# Patient Record
Sex: Male | Born: 1955
Health system: Southern US, Community
[De-identification: ages and names within clinical notes are randomized; demographics above are authoritative.]

---

## 2011-10-27 ENCOUNTER — Encounter (INDEPENDENT_AMBULATORY_CARE_PROVIDER_SITE_OTHER): Payer: 59 | Admitting: Ophthalmology

## 2011-10-27 DIAGNOSIS — H33109 Unspecified retinoschisis, unspecified eye: Secondary | ICD-10-CM

## 2011-10-27 DIAGNOSIS — I10 Essential (primary) hypertension: Secondary | ICD-10-CM

## 2011-10-27 DIAGNOSIS — H35039 Hypertensive retinopathy, unspecified eye: Secondary | ICD-10-CM

## 2011-10-27 DIAGNOSIS — H43819 Vitreous degeneration, unspecified eye: Secondary | ICD-10-CM

## 2011-10-27 DIAGNOSIS — H35729 Serous detachment of retinal pigment epithelium, unspecified eye: Secondary | ICD-10-CM

## 2011-10-27 DIAGNOSIS — E11319 Type 2 diabetes mellitus with unspecified diabetic retinopathy without macular edema: Secondary | ICD-10-CM

## 2011-10-27 DIAGNOSIS — E1139 Type 2 diabetes mellitus with other diabetic ophthalmic complication: Secondary | ICD-10-CM

## 2012-10-26 ENCOUNTER — Ambulatory Visit (INDEPENDENT_AMBULATORY_CARE_PROVIDER_SITE_OTHER): Payer: 59 | Admitting: Ophthalmology

## 2012-10-26 DIAGNOSIS — H2510 Age-related nuclear cataract, unspecified eye: Secondary | ICD-10-CM

## 2012-10-26 DIAGNOSIS — H43819 Vitreous degeneration, unspecified eye: Secondary | ICD-10-CM

## 2012-10-26 DIAGNOSIS — H33109 Unspecified retinoschisis, unspecified eye: Secondary | ICD-10-CM

## 2012-10-26 DIAGNOSIS — I10 Essential (primary) hypertension: Secondary | ICD-10-CM

## 2012-10-26 DIAGNOSIS — H35729 Serous detachment of retinal pigment epithelium, unspecified eye: Secondary | ICD-10-CM

## 2013-10-26 ENCOUNTER — Ambulatory Visit (INDEPENDENT_AMBULATORY_CARE_PROVIDER_SITE_OTHER): Payer: 59 | Admitting: Ophthalmology

## 2013-11-16 ENCOUNTER — Ambulatory Visit (INDEPENDENT_AMBULATORY_CARE_PROVIDER_SITE_OTHER): Payer: 59 | Admitting: Ophthalmology

## 2013-11-16 DIAGNOSIS — H35729 Serous detachment of retinal pigment epithelium, unspecified eye: Secondary | ICD-10-CM

## 2013-11-16 DIAGNOSIS — E11319 Type 2 diabetes mellitus with unspecified diabetic retinopathy without macular edema: Secondary | ICD-10-CM

## 2013-11-16 DIAGNOSIS — E1165 Type 2 diabetes mellitus with hyperglycemia: Secondary | ICD-10-CM

## 2013-11-16 DIAGNOSIS — H33109 Unspecified retinoschisis, unspecified eye: Secondary | ICD-10-CM

## 2013-11-16 DIAGNOSIS — E1139 Type 2 diabetes mellitus with other diabetic ophthalmic complication: Secondary | ICD-10-CM

## 2013-11-16 DIAGNOSIS — H43819 Vitreous degeneration, unspecified eye: Secondary | ICD-10-CM

## 2013-11-16 DIAGNOSIS — H2510 Age-related nuclear cataract, unspecified eye: Secondary | ICD-10-CM

## 2013-11-16 DIAGNOSIS — H35039 Hypertensive retinopathy, unspecified eye: Secondary | ICD-10-CM

## 2013-11-16 DIAGNOSIS — I10 Essential (primary) hypertension: Secondary | ICD-10-CM

## 2014-11-19 ENCOUNTER — Ambulatory Visit (INDEPENDENT_AMBULATORY_CARE_PROVIDER_SITE_OTHER): Payer: 59 | Admitting: Ophthalmology

## 2014-11-19 DIAGNOSIS — I10 Essential (primary) hypertension: Secondary | ICD-10-CM | POA: Diagnosis not present

## 2014-11-19 DIAGNOSIS — E11329 Type 2 diabetes mellitus with mild nonproliferative diabetic retinopathy without macular edema: Secondary | ICD-10-CM

## 2014-11-19 DIAGNOSIS — H33103 Unspecified retinoschisis, bilateral: Secondary | ICD-10-CM | POA: Diagnosis not present

## 2014-11-19 DIAGNOSIS — E11319 Type 2 diabetes mellitus with unspecified diabetic retinopathy without macular edema: Secondary | ICD-10-CM | POA: Diagnosis not present

## 2014-11-19 DIAGNOSIS — H43813 Vitreous degeneration, bilateral: Secondary | ICD-10-CM | POA: Diagnosis not present

## 2014-11-19 DIAGNOSIS — H35722 Serous detachment of retinal pigment epithelium, left eye: Secondary | ICD-10-CM | POA: Diagnosis not present

## 2014-11-19 DIAGNOSIS — H35033 Hypertensive retinopathy, bilateral: Secondary | ICD-10-CM | POA: Diagnosis not present

## 2015-11-19 ENCOUNTER — Ambulatory Visit (INDEPENDENT_AMBULATORY_CARE_PROVIDER_SITE_OTHER): Payer: 59 | Admitting: Ophthalmology

## 2015-11-19 DIAGNOSIS — H2513 Age-related nuclear cataract, bilateral: Secondary | ICD-10-CM

## 2015-11-19 DIAGNOSIS — E11319 Type 2 diabetes mellitus with unspecified diabetic retinopathy without macular edema: Secondary | ICD-10-CM | POA: Diagnosis not present

## 2015-11-19 DIAGNOSIS — H35033 Hypertensive retinopathy, bilateral: Secondary | ICD-10-CM

## 2015-11-19 DIAGNOSIS — I10 Essential (primary) hypertension: Secondary | ICD-10-CM

## 2015-11-19 DIAGNOSIS — E113293 Type 2 diabetes mellitus with mild nonproliferative diabetic retinopathy without macular edema, bilateral: Secondary | ICD-10-CM | POA: Diagnosis not present

## 2015-11-19 DIAGNOSIS — H43813 Vitreous degeneration, bilateral: Secondary | ICD-10-CM | POA: Diagnosis not present

## 2016-06-08 DIAGNOSIS — E785 Hyperlipidemia, unspecified: Secondary | ICD-10-CM | POA: Diagnosis not present

## 2016-06-08 DIAGNOSIS — I10 Essential (primary) hypertension: Secondary | ICD-10-CM | POA: Diagnosis not present

## 2016-06-08 DIAGNOSIS — E119 Type 2 diabetes mellitus without complications: Secondary | ICD-10-CM | POA: Diagnosis not present

## 2016-06-14 DIAGNOSIS — E291 Testicular hypofunction: Secondary | ICD-10-CM | POA: Diagnosis not present

## 2016-07-01 DIAGNOSIS — G4733 Obstructive sleep apnea (adult) (pediatric): Secondary | ICD-10-CM | POA: Diagnosis not present

## 2016-07-01 DIAGNOSIS — Z9989 Dependence on other enabling machines and devices: Secondary | ICD-10-CM | POA: Diagnosis not present

## 2016-07-01 DIAGNOSIS — I10 Essential (primary) hypertension: Secondary | ICD-10-CM | POA: Diagnosis not present

## 2016-08-20 DIAGNOSIS — R202 Paresthesia of skin: Secondary | ICD-10-CM | POA: Diagnosis not present

## 2016-11-18 ENCOUNTER — Ambulatory Visit (INDEPENDENT_AMBULATORY_CARE_PROVIDER_SITE_OTHER): Payer: 59 | Admitting: Ophthalmology

## 2016-11-18 DIAGNOSIS — H43813 Vitreous degeneration, bilateral: Secondary | ICD-10-CM | POA: Diagnosis not present

## 2016-11-18 DIAGNOSIS — I10 Essential (primary) hypertension: Secondary | ICD-10-CM

## 2016-11-18 DIAGNOSIS — H3570 Unspecified separation of retinal layers: Secondary | ICD-10-CM | POA: Diagnosis not present

## 2016-11-18 DIAGNOSIS — E113293 Type 2 diabetes mellitus with mild nonproliferative diabetic retinopathy without macular edema, bilateral: Secondary | ICD-10-CM | POA: Diagnosis not present

## 2016-11-18 DIAGNOSIS — E11319 Type 2 diabetes mellitus with unspecified diabetic retinopathy without macular edema: Secondary | ICD-10-CM | POA: Diagnosis not present

## 2016-11-18 DIAGNOSIS — H35033 Hypertensive retinopathy, bilateral: Secondary | ICD-10-CM

## 2016-12-09 DIAGNOSIS — E291 Testicular hypofunction: Secondary | ICD-10-CM | POA: Diagnosis not present

## 2016-12-09 DIAGNOSIS — N183 Chronic kidney disease, stage 3 (moderate): Secondary | ICD-10-CM | POA: Diagnosis not present

## 2016-12-09 DIAGNOSIS — E1121 Type 2 diabetes mellitus with diabetic nephropathy: Secondary | ICD-10-CM | POA: Diagnosis not present

## 2017-02-15 DIAGNOSIS — Z1211 Encounter for screening for malignant neoplasm of colon: Secondary | ICD-10-CM | POA: Diagnosis not present

## 2017-04-13 DIAGNOSIS — Z1211 Encounter for screening for malignant neoplasm of colon: Secondary | ICD-10-CM | POA: Diagnosis not present

## 2017-06-09 DIAGNOSIS — E785 Hyperlipidemia, unspecified: Secondary | ICD-10-CM | POA: Diagnosis not present

## 2017-06-09 DIAGNOSIS — I10 Essential (primary) hypertension: Secondary | ICD-10-CM | POA: Diagnosis not present

## 2017-06-09 DIAGNOSIS — N183 Chronic kidney disease, stage 3 (moderate): Secondary | ICD-10-CM | POA: Diagnosis not present

## 2017-06-09 DIAGNOSIS — E1121 Type 2 diabetes mellitus with diabetic nephropathy: Secondary | ICD-10-CM | POA: Diagnosis not present

## 2017-09-14 DIAGNOSIS — I10 Essential (primary) hypertension: Secondary | ICD-10-CM | POA: Diagnosis not present

## 2017-09-14 DIAGNOSIS — G4733 Obstructive sleep apnea (adult) (pediatric): Secondary | ICD-10-CM | POA: Diagnosis not present

## 2017-11-01 DIAGNOSIS — G4733 Obstructive sleep apnea (adult) (pediatric): Secondary | ICD-10-CM | POA: Diagnosis not present

## 2017-11-23 ENCOUNTER — Encounter (INDEPENDENT_AMBULATORY_CARE_PROVIDER_SITE_OTHER): Payer: 59 | Admitting: Ophthalmology

## 2017-12-09 DIAGNOSIS — E1121 Type 2 diabetes mellitus with diabetic nephropathy: Secondary | ICD-10-CM | POA: Diagnosis not present

## 2017-12-09 DIAGNOSIS — I10 Essential (primary) hypertension: Secondary | ICD-10-CM | POA: Diagnosis not present

## 2017-12-09 DIAGNOSIS — E785 Hyperlipidemia, unspecified: Secondary | ICD-10-CM | POA: Diagnosis not present

## 2017-12-09 DIAGNOSIS — N183 Chronic kidney disease, stage 3 (moderate): Secondary | ICD-10-CM | POA: Diagnosis not present

## 2017-12-12 ENCOUNTER — Encounter (INDEPENDENT_AMBULATORY_CARE_PROVIDER_SITE_OTHER): Payer: 59 | Admitting: Ophthalmology

## 2017-12-12 DIAGNOSIS — I10 Essential (primary) hypertension: Secondary | ICD-10-CM

## 2017-12-12 DIAGNOSIS — H35033 Hypertensive retinopathy, bilateral: Secondary | ICD-10-CM

## 2017-12-12 DIAGNOSIS — E11319 Type 2 diabetes mellitus with unspecified diabetic retinopathy without macular edema: Secondary | ICD-10-CM | POA: Diagnosis not present

## 2017-12-12 DIAGNOSIS — E113293 Type 2 diabetes mellitus with mild nonproliferative diabetic retinopathy without macular edema, bilateral: Secondary | ICD-10-CM

## 2017-12-12 DIAGNOSIS — H43813 Vitreous degeneration, bilateral: Secondary | ICD-10-CM

## 2018-02-28 ENCOUNTER — Ambulatory Visit (INDEPENDENT_AMBULATORY_CARE_PROVIDER_SITE_OTHER): Payer: Self-pay

## 2018-02-28 ENCOUNTER — Other Ambulatory Visit (INDEPENDENT_AMBULATORY_CARE_PROVIDER_SITE_OTHER): Payer: Self-pay

## 2018-02-28 ENCOUNTER — Ambulatory Visit (INDEPENDENT_AMBULATORY_CARE_PROVIDER_SITE_OTHER): Payer: 59 | Admitting: Orthopaedic Surgery

## 2018-02-28 DIAGNOSIS — G8929 Other chronic pain: Secondary | ICD-10-CM | POA: Diagnosis not present

## 2018-02-28 DIAGNOSIS — M25562 Pain in left knee: Secondary | ICD-10-CM | POA: Diagnosis not present

## 2018-02-28 NOTE — Progress Notes (Signed)
   Office Visit Note   Patient: Earl Ford           Date of Birth: 06/14/1955           MRN: [id number] Visit Date: 02/28/2018              Requested by: No referring provider defined for this encounter. PCP: Patient, No Pcp Per   Assessment & Plan: Visit Diagnoses:  1. Chronic pain of left knee     Plan: At this point patient continues to have chronic left knee pain for 5 months despite conservative treatment.  My suspicion is that he either has a degenerative medial medial meniscus tear or symptomatic plica versus patellofemoral chondromalacia.  We discussed MRI versus injection and physical therapy.  He would like to get an MRI first therefore we will order this to rule out structural abnormalities.  Follow-up after the MRI.  Follow-Up Instructions: Return in about 10 days (around 03/10/2018).   Orders:  Orders Placed This Encounter  Procedures  . XR KNEE 3 VIEW LEFT   No orders of the defined types were placed in this encounter.     Procedures: No procedures performed   Clinical Data: No additional findings.   Subjective: Chief Complaint  Patient presents with  . Left Knee - Pain    Earl BurdockRichard is a very pleasant 62 year old gentleman who comes in with 5 months of left knee pain of insidious onset.  He initially had swelling but it resolved after 2 months.  He states that he has pain on the medial side of his knee and it feels like it wants to pop out of joint.  It is very painful when he goes downstairs.  He wears a knee brace which does help with support.  He denies any numbness and tingling or radiation of pain.  The pain is better with rest.   Review of Systems  Constitutional: Negative.   All other systems reviewed and are negative.    Objective: Vital Signs: There were no vitals taken for this visit.  Physical Exam  Constitutional: He is oriented to person, place, and time. He appears well-developed and well-nourished.  HENT:  Head: Normocephalic and  atraumatic.  Eyes: Pupils are equal, round, and reactive to light.  Neck: Neck supple.  Pulmonary/Chest: Effort normal.  Abdominal: Soft.  Musculoskeletal: Normal range of motion.  Neurological: He is alert and oriented to person, place, and time.  Skin: Skin is warm.  Psychiatric: He has a normal mood and affect. His behavior is normal. Judgment and thought content normal.  Nursing note and vitals reviewed.   Ortho Exam Left knee exam shows no joint effusion.  Is no medial joint line tenderness.  Patella tracking is normal.  Normal range of motion.  Collaterals and cruciates are stable. Specialty Comments:  No specialty comments available.  Imaging: Xr Knee 3 View Left  Result Date: 02/28/2018 No evidence of degenerative joint disease.    PMFS History: There are no active problems to display for this patient.  No past medical history on file.  No family history on file.   Social History   Occupational History  . Not on file  Tobacco Use  . Smoking status: Not on file  Substance and Sexual Activity  . Alcohol use: Not on file  . Drug use: Not on file  . Sexual activity: Not on file

## 2018-03-10 ENCOUNTER — Ambulatory Visit (INDEPENDENT_AMBULATORY_CARE_PROVIDER_SITE_OTHER): Payer: 59 | Admitting: Orthopaedic Surgery

## 2018-03-20 ENCOUNTER — Ambulatory Visit
Admission: RE | Admit: 2018-03-20 | Discharge: 2018-03-20 | Disposition: A | Discharge status: Home / Self Care | Payer: 59 | Source: Ambulatory Visit | Attending: Orthopaedic Surgery | Admitting: Orthopaedic Surgery

## 2018-03-20 DIAGNOSIS — M25562 Pain in left knee: Principal | ICD-10-CM

## 2018-03-20 DIAGNOSIS — M23322 Other meniscus derangements, posterior horn of medial meniscus, left knee: Secondary | ICD-10-CM | POA: Diagnosis not present

## 2018-03-20 DIAGNOSIS — G8929 Other chronic pain: Secondary | ICD-10-CM

## 2018-03-30 ENCOUNTER — Encounter (INDEPENDENT_AMBULATORY_CARE_PROVIDER_SITE_OTHER): Payer: Self-pay | Admitting: Orthopaedic Surgery

## 2018-03-30 ENCOUNTER — Ambulatory Visit (INDEPENDENT_AMBULATORY_CARE_PROVIDER_SITE_OTHER): Payer: 59 | Admitting: Orthopaedic Surgery

## 2018-03-30 DIAGNOSIS — S83242A Other tear of medial meniscus, current injury, left knee, initial encounter: Secondary | ICD-10-CM | POA: Diagnosis not present

## 2018-03-30 NOTE — Progress Notes (Signed)
   Office Visit Note   Patient: Earl Ford           Date of Birth: 05-03-1955           MRN: [id number] Visit Date: 03/30/2018              Requested by: No referring provider defined for this encounter. PCP: Patient, No Pcp Per   Assessment & Plan: Visit Diagnoses:  1. Acute tear medial meniscus, left, initial encounter     Plan: MRI shows an undersurface horizontal tear of the medial meniscus.  The meniscus does not appear to be extruded.  Overall symptomatically seems to be doing okay and he does not have an effusion.  He still is able to be pretty active.  At this point patient will just try to live with this and try a knee brace.  He has had a previous arthroscopic knee surgery on the other knee for a torn meniscus and he feels like this pain is not anywhere close to how it was before.  I offered a cortisone injection but he declined.  He will let us know if he starts to have problems with his knee.  Otherwise he can follow-up as needed.  Follow-Up Instructions: Return if symptoms worsen or fail to improve.   Orders:  No orders of the defined types were placed in this encounter.  No orders of the defined types were placed in this encounter.     Procedures: No procedures performed   Clinical Data: No additional findings.   Subjective: Chief Complaint  Patient presents with  . Left Knee - Pain, Follow-up    Mr. Ginley follows up today for review of his left knee MRI.  He states that overall it is not overly symptomatic.  Denies any swelling.  The pain is mild and is localized to the medial aspect of the knee.  He denies any consistent mechanical symptoms.  Sometimes it feels like his joint is popping out of place.   Review of Systems   Objective: Vital Signs: There were no vitals taken for this visit.  Physical Exam  Ortho Exam Left knee exam shows no joint effusion.  No medial joint tenderness.  He has some referred pain on the medial side of his  knee. Specialty Comments:  No specialty comments available.  Imaging: No results found.   PMFS History: Patient Active Problem List   Diagnosis Date Noted  . Acute tear medial meniscus, left, initial encounter 03/30/2018   History reviewed. No pertinent past medical history.  History reviewed. No pertinent family history.  History reviewed. No pertinent surgical history. Social History   Occupational History  . Not on file  Tobacco Use  . Smoking status: Not on file  Substance and Sexual Activity  . Alcohol use: Not on file  . Drug use: Not on file  . Sexual activity: Not on file

## 2018-06-26 DIAGNOSIS — Z23 Encounter for immunization: Secondary | ICD-10-CM | POA: Diagnosis not present

## 2018-06-26 DIAGNOSIS — E785 Hyperlipidemia, unspecified: Secondary | ICD-10-CM | POA: Diagnosis not present

## 2018-06-26 DIAGNOSIS — I10 Essential (primary) hypertension: Secondary | ICD-10-CM | POA: Diagnosis not present

## 2018-06-26 DIAGNOSIS — Z125 Encounter for screening for malignant neoplasm of prostate: Secondary | ICD-10-CM | POA: Diagnosis not present

## 2018-06-26 DIAGNOSIS — E1121 Type 2 diabetes mellitus with diabetic nephropathy: Secondary | ICD-10-CM | POA: Diagnosis not present

## 2018-06-26 DIAGNOSIS — Z0000 Encounter for general adult medical examination without abnormal findings: Secondary | ICD-10-CM | POA: Diagnosis not present

## 2018-07-14 DIAGNOSIS — E119 Type 2 diabetes mellitus without complications: Secondary | ICD-10-CM | POA: Diagnosis not present

## 2018-12-14 ENCOUNTER — Encounter (INDEPENDENT_AMBULATORY_CARE_PROVIDER_SITE_OTHER): Payer: 59 | Admitting: Ophthalmology

## 2018-12-27 DIAGNOSIS — N183 Chronic kidney disease, stage 3 unspecified: Secondary | ICD-10-CM | POA: Diagnosis not present

## 2018-12-27 DIAGNOSIS — E785 Hyperlipidemia, unspecified: Secondary | ICD-10-CM | POA: Diagnosis not present

## 2018-12-27 DIAGNOSIS — E1121 Type 2 diabetes mellitus with diabetic nephropathy: Secondary | ICD-10-CM | POA: Diagnosis not present

## 2018-12-27 DIAGNOSIS — I10 Essential (primary) hypertension: Secondary | ICD-10-CM | POA: Diagnosis not present

## 2019-01-04 DIAGNOSIS — N183 Chronic kidney disease, stage 3 unspecified: Secondary | ICD-10-CM | POA: Diagnosis not present

## 2019-01-04 DIAGNOSIS — Z23 Encounter for immunization: Secondary | ICD-10-CM | POA: Diagnosis not present

## 2019-01-04 DIAGNOSIS — E1121 Type 2 diabetes mellitus with diabetic nephropathy: Secondary | ICD-10-CM | POA: Diagnosis not present

## 2019-02-26 DIAGNOSIS — F330 Major depressive disorder, recurrent, mild: Secondary | ICD-10-CM | POA: Diagnosis not present

## 2019-02-27 DIAGNOSIS — F330 Major depressive disorder, recurrent, mild: Secondary | ICD-10-CM | POA: Diagnosis not present

## 2019-06-29 DIAGNOSIS — Z0000 Encounter for general adult medical examination without abnormal findings: Secondary | ICD-10-CM | POA: Diagnosis not present

## 2019-06-29 DIAGNOSIS — Z125 Encounter for screening for malignant neoplasm of prostate: Secondary | ICD-10-CM | POA: Diagnosis not present

## 2019-06-29 DIAGNOSIS — E785 Hyperlipidemia, unspecified: Secondary | ICD-10-CM | POA: Diagnosis not present

## 2019-06-29 DIAGNOSIS — Z5181 Encounter for therapeutic drug level monitoring: Secondary | ICD-10-CM | POA: Diagnosis not present

## 2019-06-29 DIAGNOSIS — G47419 Narcolepsy without cataplexy: Secondary | ICD-10-CM | POA: Diagnosis not present

## 2019-06-29 DIAGNOSIS — I10 Essential (primary) hypertension: Secondary | ICD-10-CM | POA: Diagnosis not present

## 2019-06-29 DIAGNOSIS — E1121 Type 2 diabetes mellitus with diabetic nephropathy: Secondary | ICD-10-CM | POA: Diagnosis not present

## 2019-09-28 ENCOUNTER — Encounter (INDEPENDENT_AMBULATORY_CARE_PROVIDER_SITE_OTHER): Payer: BC Managed Care – PPO | Admitting: Ophthalmology

## 2019-09-28 ENCOUNTER — Other Ambulatory Visit: Payer: Self-pay

## 2019-09-28 DIAGNOSIS — E11319 Type 2 diabetes mellitus with unspecified diabetic retinopathy without macular edema: Secondary | ICD-10-CM | POA: Diagnosis not present

## 2019-09-28 DIAGNOSIS — I10 Essential (primary) hypertension: Secondary | ICD-10-CM | POA: Diagnosis not present

## 2019-09-28 DIAGNOSIS — E113291 Type 2 diabetes mellitus with mild nonproliferative diabetic retinopathy without macular edema, right eye: Secondary | ICD-10-CM

## 2019-09-28 DIAGNOSIS — E113392 Type 2 diabetes mellitus with moderate nonproliferative diabetic retinopathy without macular edema, left eye: Secondary | ICD-10-CM

## 2019-09-28 DIAGNOSIS — H35033 Hypertensive retinopathy, bilateral: Secondary | ICD-10-CM

## 2019-09-28 DIAGNOSIS — H43813 Vitreous degeneration, bilateral: Secondary | ICD-10-CM

## 2019-12-14 ENCOUNTER — Encounter (INDEPENDENT_AMBULATORY_CARE_PROVIDER_SITE_OTHER): Payer: 59 | Admitting: Ophthalmology

## 2020-01-04 DIAGNOSIS — F3341 Major depressive disorder, recurrent, in partial remission: Secondary | ICD-10-CM | POA: Diagnosis not present

## 2020-01-04 DIAGNOSIS — G47419 Narcolepsy without cataplexy: Secondary | ICD-10-CM | POA: Diagnosis not present

## 2020-01-04 DIAGNOSIS — I10 Essential (primary) hypertension: Secondary | ICD-10-CM | POA: Diagnosis not present

## 2020-01-04 DIAGNOSIS — Z23 Encounter for immunization: Secondary | ICD-10-CM | POA: Diagnosis not present

## 2020-01-04 DIAGNOSIS — E1121 Type 2 diabetes mellitus with diabetic nephropathy: Secondary | ICD-10-CM | POA: Diagnosis not present

## 2020-10-03 ENCOUNTER — Other Ambulatory Visit: Payer: Self-pay

## 2020-10-03 ENCOUNTER — Encounter (INDEPENDENT_AMBULATORY_CARE_PROVIDER_SITE_OTHER): Payer: Managed Care, Other (non HMO) | Admitting: Ophthalmology

## 2020-10-03 DIAGNOSIS — I10 Essential (primary) hypertension: Secondary | ICD-10-CM

## 2020-10-03 DIAGNOSIS — E113392 Type 2 diabetes mellitus with moderate nonproliferative diabetic retinopathy without macular edema, left eye: Secondary | ICD-10-CM | POA: Diagnosis not present

## 2020-10-03 DIAGNOSIS — H2513 Age-related nuclear cataract, bilateral: Secondary | ICD-10-CM

## 2020-10-03 DIAGNOSIS — E113291 Type 2 diabetes mellitus with mild nonproliferative diabetic retinopathy without macular edema, right eye: Secondary | ICD-10-CM | POA: Diagnosis not present

## 2020-10-03 DIAGNOSIS — H35722 Serous detachment of retinal pigment epithelium, left eye: Secondary | ICD-10-CM

## 2020-10-03 DIAGNOSIS — H43813 Vitreous degeneration, bilateral: Secondary | ICD-10-CM

## 2020-10-03 DIAGNOSIS — H35033 Hypertensive retinopathy, bilateral: Secondary | ICD-10-CM

## 2020-11-03 IMAGING — MR MR KNEE*L* W/O CM
4 of 6 series · 22 of 40 positions shown · non-contrast
Comparison: None.

CLINICAL DATA: The patient feels as if his left knee is popping out
of joint. Pain. No known injury.

EXAM:
MRI OF THE LEFT KNEE WITHOUT CONTRAST
TECHNIQUE: Multiplanar, multisequence MR imaging of the knee was performed. No
intravenous contrast was administered.

[Series 3: T2 fat-sat · axial · 4.0mm · 0.50mm/px · z∈[-68,+47]mm · 4 of 24 slices shown (1 of 2)]
[im 1/24]
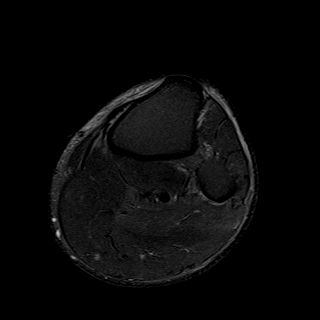
[im 5/24]
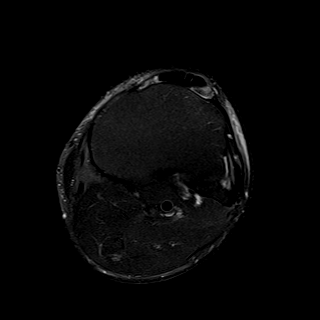
[im 14/24]
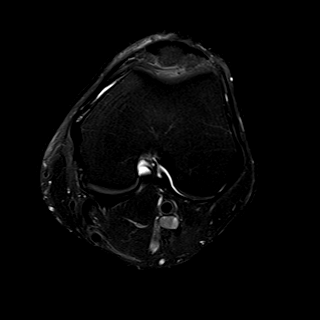
[im 24/24]
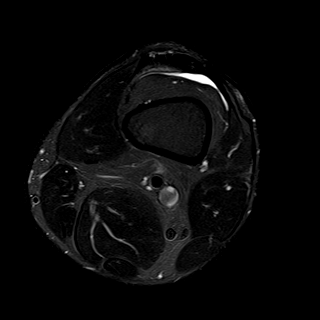

[Series 5: T2 fat-sat · coronal · 4.0mm · 0.29mm/px · 3 of 24 slices shown (2 of 2)]
[im 5/24]
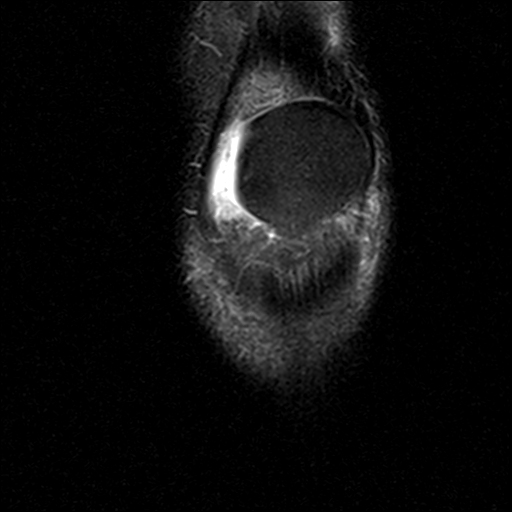
[im 14/24]
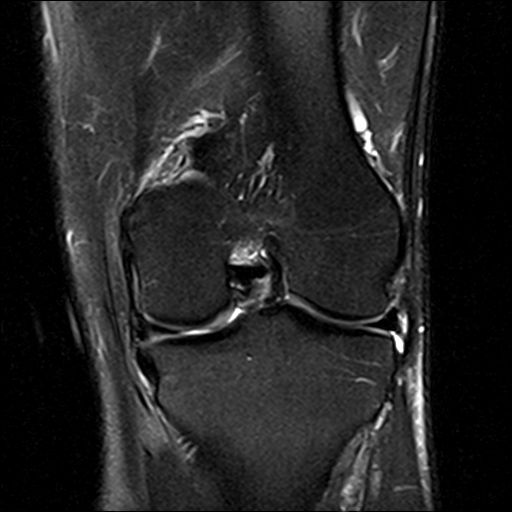
[im 24/24]
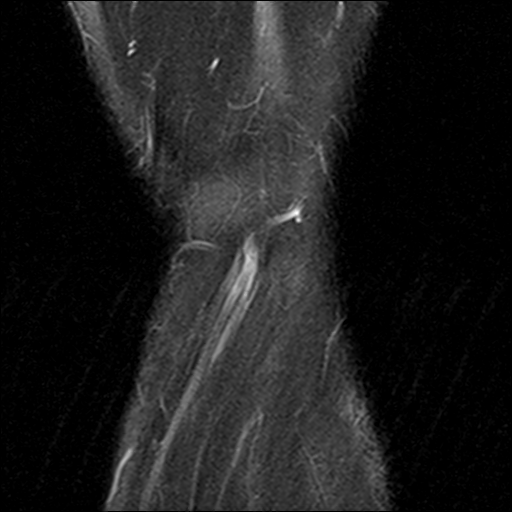

[Series 6: PD fat-sat · sagittal · 3.0mm · 0.29mm/px · 7 of 27 slices shown (1 of 2)]
[im 1/27]
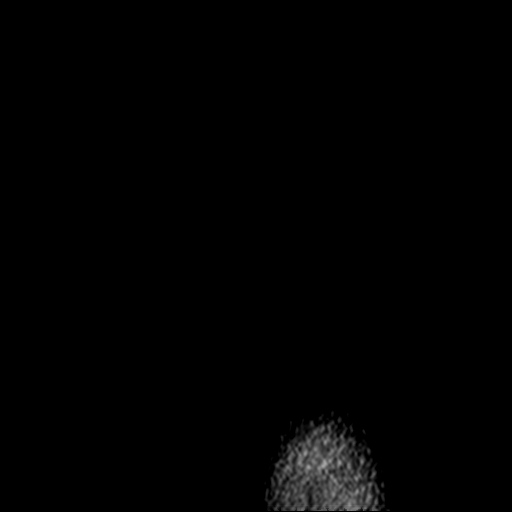
[im 5/27]
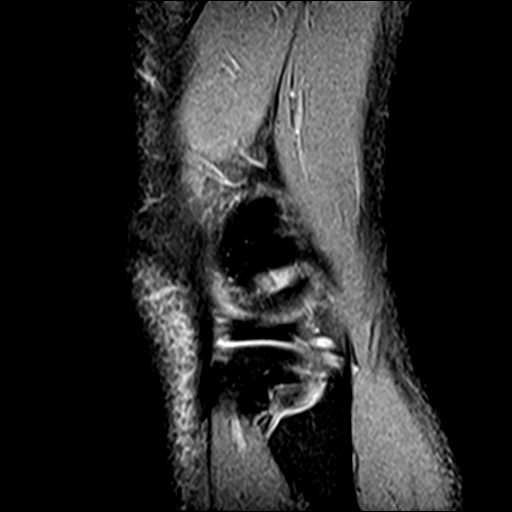
[im 9/27]
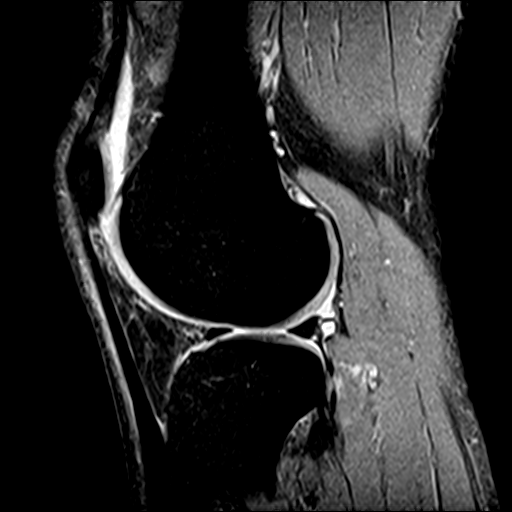
[im 14/27]
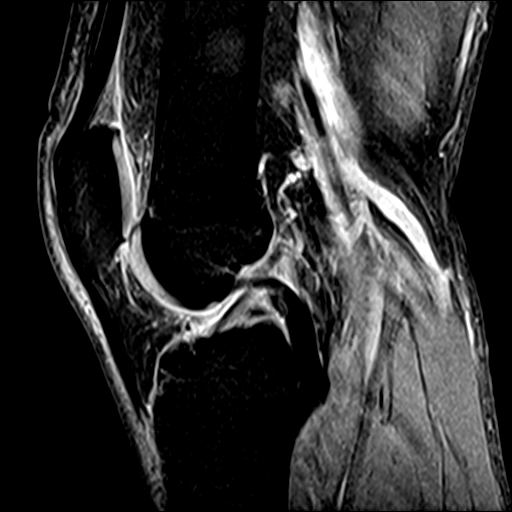
[im 18/27]
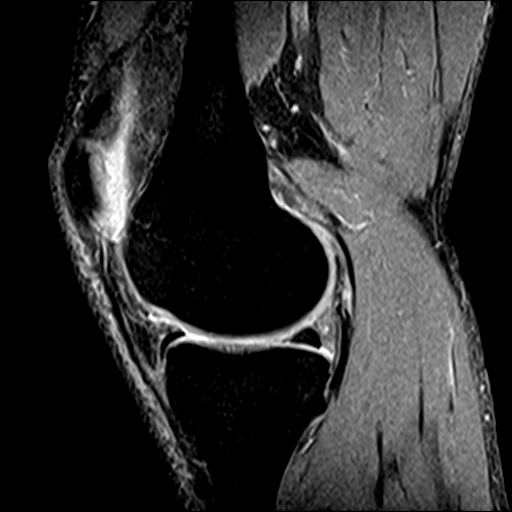
[im 22/27]
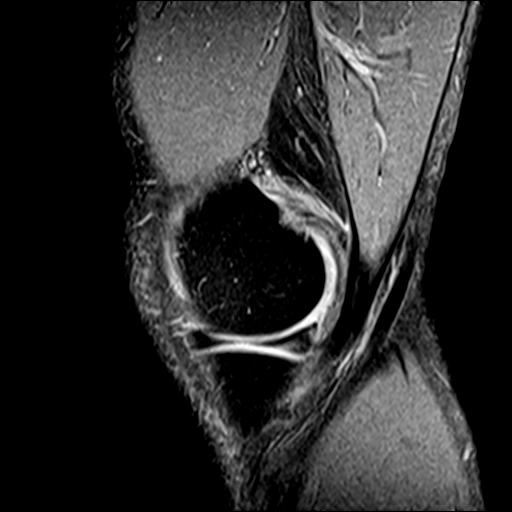
[im 27/27]
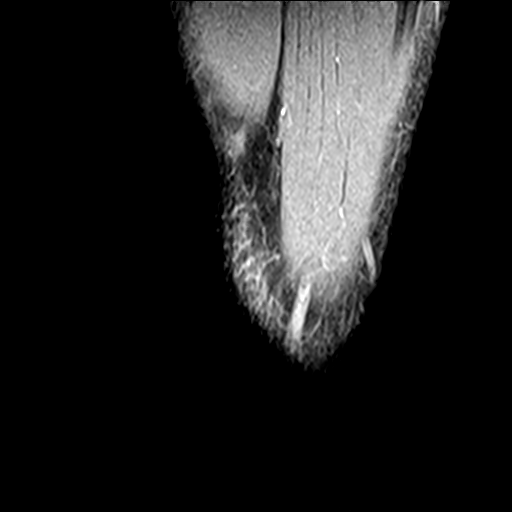

[Series 8: PD fat-sat · coronal · 3.0mm · 0.29mm/px · 8 of 30 slices shown (2 of 2)]
[im 1/30]
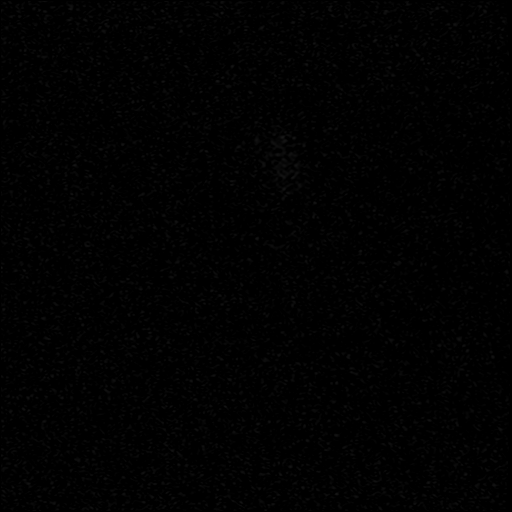
[im 5/30]
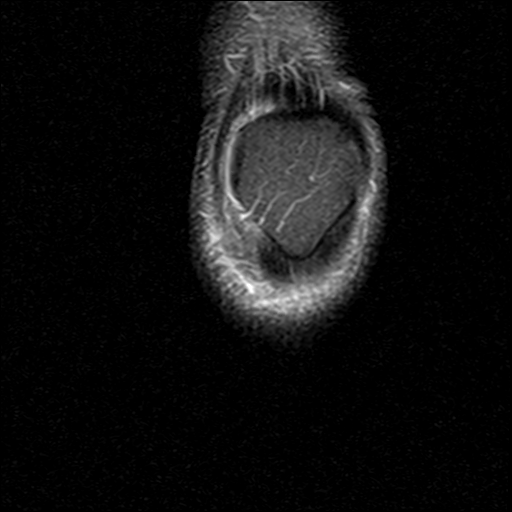
[im 9/30]
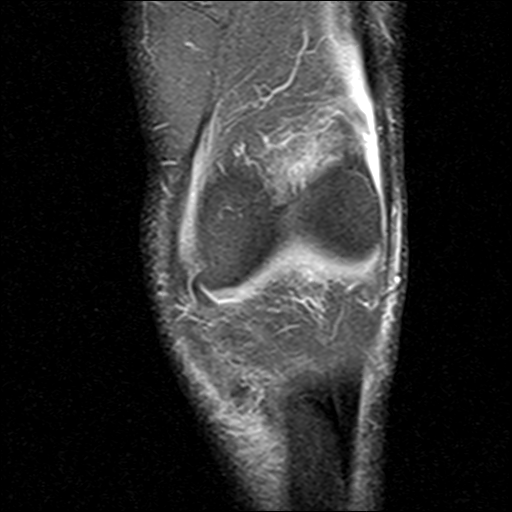
[im 13/30]
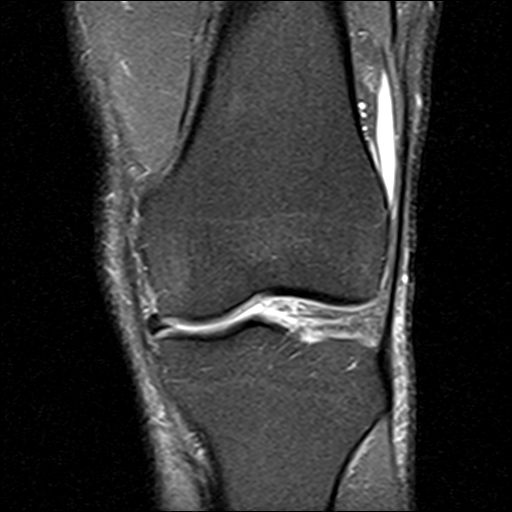
[im 17/30]
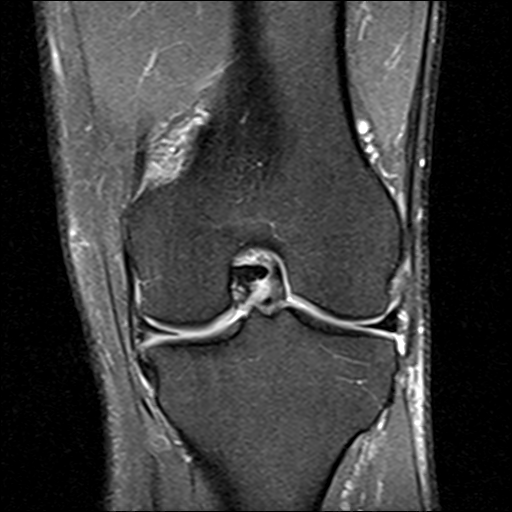
[im 21/30]
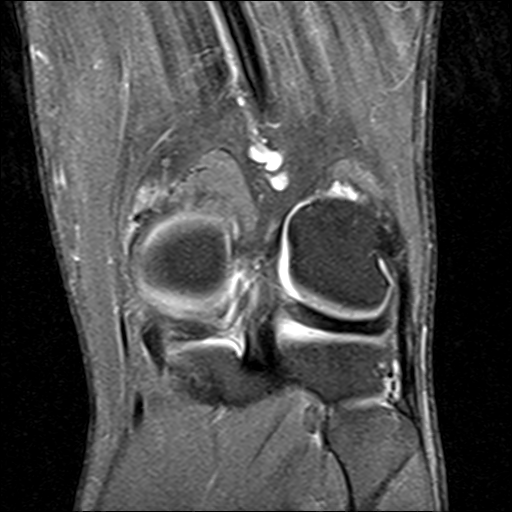
[im 25/30]
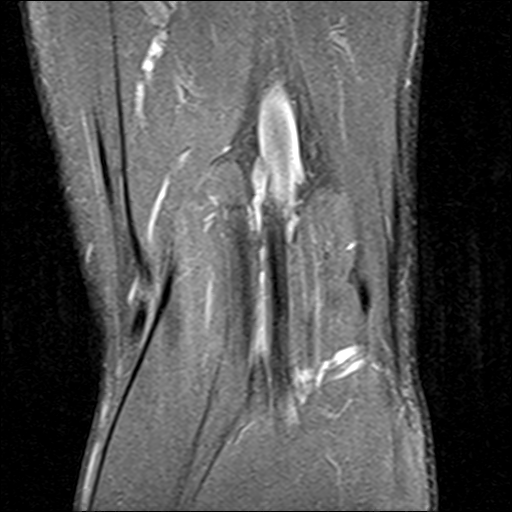
[im 30/30]
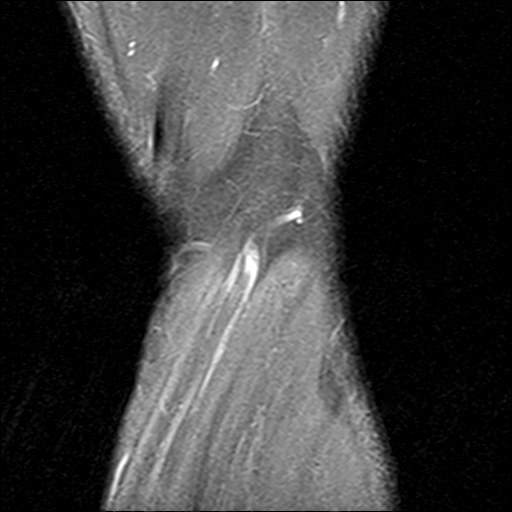

[22 of 40 positions shown; findings below may reference images not displayed]

FINDINGS: MENISCI

Medial meniscus: The patient has a horizontal tear in the posterior
horn of the medial meniscus reaching the meniscal undersurface and
extending just into the posterior body.

Lateral meniscus:  Intact.

LIGAMENTS

Cruciates:  Intact.

Collaterals:  Intact.

CARTILAGE

Patellofemoral:  Preserved.

Medial:  Minimally degenerated.

Lateral:  Preserved.

Joint:  Small effusion.

Popliteal Fossa:  No Baker's cyst.

Extensor Mechanism:  Intact.

Bones:  Normal marrow signal throughout.

Other: None.
IMPRESSION: Horizontal tear posterior horn medial meniscus extends into the
posterior aspect of the meniscal body. No displaced fragment. The
exam is otherwise negative.

## 2021-10-02 ENCOUNTER — Encounter (INDEPENDENT_AMBULATORY_CARE_PROVIDER_SITE_OTHER): Payer: 59 | Admitting: Ophthalmology

## 2021-12-04 ENCOUNTER — Encounter (INDEPENDENT_AMBULATORY_CARE_PROVIDER_SITE_OTHER): Payer: Managed Care, Other (non HMO) | Admitting: Ophthalmology

## 2021-12-04 DIAGNOSIS — E113293 Type 2 diabetes mellitus with mild nonproliferative diabetic retinopathy without macular edema, bilateral: Secondary | ICD-10-CM

## 2021-12-04 DIAGNOSIS — H33193 Other retinoschisis and retinal cysts, bilateral: Secondary | ICD-10-CM

## 2021-12-04 DIAGNOSIS — H353122 Nonexudative age-related macular degeneration, left eye, intermediate dry stage: Secondary | ICD-10-CM

## 2021-12-04 DIAGNOSIS — H35033 Hypertensive retinopathy, bilateral: Secondary | ICD-10-CM

## 2021-12-04 DIAGNOSIS — H43813 Vitreous degeneration, bilateral: Secondary | ICD-10-CM

## 2021-12-04 DIAGNOSIS — I10 Essential (primary) hypertension: Secondary | ICD-10-CM

## 2022-10-04 ENCOUNTER — Encounter (INDEPENDENT_AMBULATORY_CARE_PROVIDER_SITE_OTHER): Payer: Medicare Other | Admitting: Ophthalmology
